# Patient Record
Sex: Female | Born: 1960 | Race: White | Hispanic: No | Marital: Married | Smoking: Current every day smoker
Health system: Southern US, Community
[De-identification: ages and names within clinical notes are randomized; demographics above are authoritative.]

## PROBLEM LIST (undated history)

## (undated) DIAGNOSIS — Z789 Other specified health status: Secondary | ICD-10-CM

---

## 1996-09-21 HISTORY — PX: NASAL SEPTUM SURGERY: SHX37

## 1998-05-23 ENCOUNTER — Other Ambulatory Visit: Admission: RE | Admit: 1998-05-23 | Discharge: 1998-05-23 | Payer: Self-pay | Admitting: Obstetrics and Gynecology

## 2000-04-19 ENCOUNTER — Other Ambulatory Visit: Admission: RE | Admit: 2000-04-19 | Discharge: 2000-04-19 | Payer: Self-pay | Admitting: Gynecology

## 2004-08-28 ENCOUNTER — Other Ambulatory Visit: Admission: RE | Admit: 2004-08-28 | Discharge: 2004-08-28 | Payer: Self-pay | Admitting: Gynecology

## 2006-03-25 ENCOUNTER — Other Ambulatory Visit: Admission: RE | Admit: 2006-03-25 | Discharge: 2006-03-25 | Payer: Self-pay | Admitting: Gynecology

## 2006-10-25 ENCOUNTER — Other Ambulatory Visit: Admission: RE | Admit: 2006-10-25 | Discharge: 2006-10-25 | Payer: Self-pay | Admitting: Gynecology

## 2007-03-28 ENCOUNTER — Other Ambulatory Visit: Admission: RE | Admit: 2007-03-28 | Discharge: 2007-03-28 | Payer: Self-pay | Admitting: Gynecology

## 2008-05-09 ENCOUNTER — Other Ambulatory Visit: Admission: RE | Admit: 2008-05-09 | Discharge: 2008-05-09 | Payer: Self-pay | Admitting: Gynecology

## 2014-02-20 ENCOUNTER — Encounter: Payer: Self-pay | Admitting: Family Medicine

## 2014-03-12 ENCOUNTER — Encounter: Payer: Self-pay | Admitting: Physician Assistant

## 2014-03-12 ENCOUNTER — Ambulatory Visit (INDEPENDENT_AMBULATORY_CARE_PROVIDER_SITE_OTHER): Payer: BC Managed Care – PPO | Admitting: Physician Assistant

## 2014-03-12 VITALS — BP 132/78 | HR 68 | Temp 98.5°F | Resp 18 | Ht 65.75 in | Wt 140.0 lb

## 2014-03-12 DIAGNOSIS — Z23 Encounter for immunization: Secondary | ICD-10-CM

## 2014-03-12 DIAGNOSIS — F17200 Nicotine dependence, unspecified, uncomplicated: Secondary | ICD-10-CM

## 2014-03-12 DIAGNOSIS — Z0000 Encounter for general adult medical examination without abnormal findings: Secondary | ICD-10-CM

## 2014-03-12 MED ORDER — VARENICLINE TARTRATE 1 MG PO TABS: 1.0000 mg | ORAL_TABLET | Freq: Two times a day (BID) | ORAL | Status: DC

## 2014-03-12 MED ORDER — VARENICLINE TARTRATE 0.5 MG PO TABS: 0.5000 mg | ORAL_TABLET | Freq: Two times a day (BID) | ORAL | Status: DC

## 2014-03-12 NOTE — Progress Notes (Signed)
Patient ID: Laura Beasley MRN: [id number], DOB: Feb 17, 1961, 53 y.o. Date of Encounter: 03/12/2014,   Chief Complaint: Physical (CPE)  HPI: 53 y.o. y/o white female  here for CPE. Is also here as a new patient to establish care with our office.  She says that she has seen a medical provider in 4 years. Prior to that she was seeing a gynecologist in [place]. She has had no other medical provider other than her GYN.  She moved from [place] in 1993.  She has no specific complaints or concerns today.   Review of Systems: Consitutional: No fever, chills, fatigue, night sweats, lymphadenopathy. No significant/unexplained weight changes. Eyes: No visual changes, eye redness, or discharge. ENT/Mouth: No ear pain, sore throat, nasal drainage, or sinus pain. Cardiovascular: No chest pressure,heaviness, tightness or squeezing, even with exertion. No increased shortness of breath or dyspnea on exertion.No palpitations, edema, orthopnea, PND. Respiratory: No cough, hemoptysis, SOB, or wheezing. Gastrointestinal: No anorexia, dysphagia, reflux, pain, nausea, vomiting, hematemesis, diarrhea, constipation, BRBPR, or melena. Breast: No mass, nodules, bulging, or retraction. No skin changes or inflammation. No nipple discharge. No lymphadenopathy. Genitourinary: No dysuria, hematuria, incontinence, vaginal discharge, pruritis, burning, abnormal bleeding, or pain. Musculoskeletal: No decreased ROM, No joint pain or swelling. No significant pain in neck, back, or extremities. Skin: No rash, pruritis, or concerning lesions. Neurological: No headache, dizziness, syncope, seizures, tremors, memory loss, coordination problems, or paresthesias. Psychological: No anxiety, depression, hallucinations, SI/HI. Endocrine: No polydipsia, polyphagia, polyuria, or known diabetes.No increased fatigue. No palpitations/rapid heart rate. No significant/unexplained weight change. All other systems were reviewed and are  otherwise negative.  Past Medical History  Diagnosis Date  . Allergy 2003     Past Surgical History  Procedure Laterality Date  . Cesarean section  05/14/93  . Nasal septum surgery  09/21/1996    Home Meds:  No outpatient prescriptions prior to visit.   No facility-administered medications prior to visit.    Allergies:  Allergies  Allergen Reactions  . Depo-Provera [Medroxyprogesterone]     History   Social History  . Marital Status: Married    Spouse Name: N/A    Number of Children: N/A  . Years of Education: N/A   Occupational History  . Not on file.   Social History Main Topics  . Smoking status: Current Every Day Smoker -- 1.00 packs/day for 35 years    Types: Cigarettes  . Smokeless tobacco: Never Used  . Alcohol Use: No  . Drug Use: No  . Sexual Activity: Yes    Birth Control/ Protection: None   Other Topics Concern  . 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   Has 10 acres-- does walking around there.   No Formal exercise.   Smokes 1ppd since age 31.   No alcohol.    Family History  Problem Relation Age of Onset  . Diabetes Father   . Heart disease Father     Physical Exam: Blood pressure 132/78, pulse 68, temperature 98.5 F (36.9 C), temperature source Oral, resp. rate 18, height 5' 5.75" (1.67 m), weight 140 lb (63.504 kg)., Body mass index is 22.77 kg/(m^2). General: Well developed, well nourished, WF. Appears in no acute distress. HEENT: Normocephalic, atraumatic. Conjunctiva pink, sclera non-icteric. Pupils 2 mm constricting to 1 mm, round, regular, and equally reactive to light and accomodation. EOMI. Internal auditory canal clear. TMs with good cone of light and without pathology. Nasal mucosa  pink. Nares are without discharge. No sinus tenderness. Oral mucosa pink.  Pharynx without exudate.   Neck: Supple. Trachea midline. No thyromegaly. Full ROM. No  lymphadenopathy.No Carotid Bruits. Lungs: Clear to auscultation bilaterally without wheezes, rales, or rhonchi. Breathing is of normal effort and unlabored. Cardiovascular: RRR with S1 S2. No murmurs, rubs, or gallops. Distal pulses 2+ symmetrically. No carotid or abdominal bruits. Breast: Symmetrical. No masses. Nipples without discharge. Abdomen: Soft, non-tender, non-distended with normoactive bowel sounds. No hepatosplenomegaly or masses. No rebound/guarding. No CVA tenderness. No hernias.  Genitourinary:  External genitalia without lesions. Vaginal mucosa pink.No discharge present. Cervix pink and without discharge. No cervical tenderness.Normal uterus size. No adnexal mass or tenderness.  Pap smear taken. Musculoskeletal: Full range of motion and 5/5 strength throughout. Without swelling, atrophy, tenderness, crepitus, or warmth. Extremities without clubbing, cyanosis, or edema. Calves supple. Skin: Warm and moist without erythema, ecchymosis, wounds, or rash. Neuro: A+Ox3. CN II-XII grossly intact. Moves all extremities spontaneously. Full sensation throughout. Normal gait. DTR 2+ throughout upper and lower extremities. Finger to nose intact. Psych:  Responds to questions appropriately with a normal affect.   Assessment/Plan:  53 y.o.  y/o female here for CPE 1. Visit for preventive health examination  A. Screening Labs: She currently is not fasting but says that she can easily return fasting for lab work. - CBC with Differential; Future - COMPLETE METABOLIC PANEL WITH GFR; Future - Lipid panel; Future - TSH; Future - MM Digital Screening; Future - Ambulatory referral to Gastroenterology  B. Pap: She says her last Pap smear was at least 4 or 5 years ago. - PAP, Thin Prep w/HPV rflx HPV Type 16/18  C. screening mammogram: She says that last mammogram was at least 4 or 5 years ago. She is agreeable for me to schedule followup mammogram.  D. DEXA/BMD: wait until closer to age 53 to  schedule this.  E.Colorectal Cancer Screening: She has had never had a screening colonoscopy. Discussed the risk and benefits and she is agreeable to proceed. Will refer her to GI for this.  F. Immunizations:  Influenza:--------------N/A Tetanus:--------------- she says that this is over due and she is agreeable to get this today. Pneumococcal:------ and that she is a smoker she needs to have pneumonia vaccine. Recommended pneumonia vaccine today but she defers.(Hopefeully she will quit smoking with Chantix) Zostavax:--------------Will discuss at age 40.  2. Smoker Discussed the need for smoking cessation. She is agreeable to take some medication to help with this and agrees that she does need to quit smoking. Discussed possible adverse effects with the medication including possible change in mood or behavior and even suicidal ideation. She voices understanding and agrees to stop the medicine and call me immediately if develops any such adverse effects. Gave her a savings card as well as a starting pack of medication sample. - varenicline (CHANTIX) 0.5 MG tablet; Take 1 tablet (0.5 mg total) by mouth 2 (two) times daily.  Dispense: 60 tablet; Refill: 0 - varenicline (CHANTIX CONTINUING MONTH PAK) 1 MG tablet; Take 1 tablet (1 mg total) by mouth 2 (two) times daily.  Dispense: 60 tablet; Refill: 3  3. Need for prophylactic vaccination with combined diphtheria-tetanus-pertussis (DTP) vaccine - Tdap vaccine greater than or equal to 7yo IM   Signed, [address] [place], [place], [place] 03/12/2014 1:41 PM

## 2014-03-13 ENCOUNTER — Other Ambulatory Visit: Payer: BC Managed Care – PPO

## 2014-03-13 DIAGNOSIS — Z0000 Encounter for general adult medical examination without abnormal findings: Secondary | ICD-10-CM

## 2014-03-13 LAB — LIPID PANEL
CHOL/HDL RATIO: 3 ratio
Cholesterol: 201 mg/dL — ABNORMAL HIGH (ref 0–200)
HDL: 68 mg/dL (ref >39)
LDL Cholesterol: 109 mg/dL — ABNORMAL HIGH (ref 0–99)
Triglycerides: 120 mg/dL (ref <150)
VLDL: 24 mg/dL (ref 0–40)

## 2014-03-13 LAB — COMPLETE METABOLIC PANEL WITH GFR
ALBUMIN: 4.3 g/dL (ref 3.5–5.2)
ALK PHOS: 57 U/L (ref 39–117)
ALT: 10 U/L (ref 0–35)
AST: 15 U/L (ref 0–37)
BUN: 17 mg/dL (ref 6–23)
CALCIUM: 9.2 mg/dL (ref 8.4–10.5)
CHLORIDE: 106 meq/L (ref 96–112)
CO2: 23 meq/L (ref 19–32)
Creat: 1 mg/dL (ref 0.50–1.10)
GFR, EST NON AFRICAN AMERICAN: 66 mL/min
GFR, Est African American: 76 mL/min
GLUCOSE: 96 mg/dL (ref 70–99)
POTASSIUM: 4.2 meq/L (ref 3.5–5.3)
SODIUM: 138 meq/L (ref 135–145)
TOTAL PROTEIN: 7 g/dL (ref 6.0–8.3)
Total Bilirubin: 0.5 mg/dL (ref 0.2–1.2)

## 2014-03-13 LAB — CBC WITH DIFFERENTIAL/PLATELET
BASOS PCT: 1 % (ref 0–1)
Basophils Absolute: 0.1 10*3/uL (ref 0.0–0.1)
Eosinophils Absolute: 0.2 10*3/uL (ref 0.0–0.7)
Eosinophils Relative: 2 % (ref 0–5)
HEMATOCRIT: 41.3 % (ref 36.0–46.0)
Hemoglobin: 14.1 g/dL (ref 12.0–15.0)
LYMPHS ABS: 2.5 10*3/uL (ref 0.7–4.0)
LYMPHS PCT: 26 % (ref 12–46)
MCH: 28.9 pg (ref 26.0–34.0)
MCHC: 34.1 g/dL (ref 30.0–36.0)
MCV: 84.6 fL (ref 78.0–100.0)
MONO ABS: 0.7 10*3/uL (ref 0.1–1.0)
Monocytes Relative: 7 % (ref 3–12)
NEUTROS ABS: 6.3 10*3/uL (ref 1.7–7.7)
NEUTROS PCT: 64 % (ref 43–77)
Platelets: 257 10*3/uL (ref 150–400)
RBC: 4.88 MIL/uL (ref 3.87–5.11)
RDW: 14.5 % (ref 11.5–15.5)
WBC: 9.8 10*3/uL (ref 4.0–10.5)

## 2014-03-13 LAB — TSH: TSH: 0.816 u[IU]/mL (ref 0.350–4.500)

## 2014-03-14 LAB — PAP, THIN PREP W/HPV RFLX HPV TYPE 16/18: HPV DNA High Risk: NOT DETECTED

## 2014-03-19 ENCOUNTER — Ambulatory Visit
Admission: RE | Admit: 2014-03-19 | Discharge: 2014-03-19 | Disposition: A | Discharge status: Home / Self Care | Payer: BC Managed Care – PPO | Source: Ambulatory Visit | Attending: Physician Assistant | Admitting: Physician Assistant

## 2014-03-19 DIAGNOSIS — Z0000 Encounter for general adult medical examination without abnormal findings: Secondary | ICD-10-CM

## 2014-03-22 ENCOUNTER — Telehealth: Payer: Self-pay

## 2014-03-22 ENCOUNTER — Other Ambulatory Visit: Payer: Self-pay

## 2014-03-22 DIAGNOSIS — Z1211 Encounter for screening for malignant neoplasm of colon: Secondary | ICD-10-CM

## 2014-03-22 NOTE — Telephone Encounter (Signed)
Pt was referred by Dena Billet for screening colonoscopy. LMOM to call. I called and many rings and no answer.

## 2014-03-22 NOTE — Telephone Encounter (Signed)
Gastroenterology Pre-Procedure Review  Request Date: 03/22/2014 Requesting Physician: Olean Ree Dixon  PATIENT REVIEW QUESTIONS: The patient responded to the following health history questions as indicated:    1. Diabetes Melitis: no 2. Joint replacements in the past 12 months: no 3. Major health problems in the past 3 months: no 4. Has an artificial valve or MVP: no 5. Has a defibrillator: no 6. Has been advised in past to take antibiotics in advance of a procedure like teeth cleaning: no    MEDICATIONS & ALLERGIES:    Patient reports the following regarding taking any blood thinners:   Plavix? no Aspirin? no Coumadin? no  Patient confirms/reports the following medications:  Current Outpatient Prescriptions  Medication Sig Dispense Refill  . POTASSIUM GLUCONATE PO Take 1 capsule by mouth as needed. Takes for muscle cramps      . varenicline (CHANTIX) 0.5 MG tablet Take 1 tablet (0.5 mg total) by mouth 2 (two) times daily.  60 tablet  0  . varenicline (CHANTIX CONTINUING MONTH PAK) 1 MG tablet Take 1 tablet (1 mg total) by mouth 2 (two) times daily.  60 tablet  3   No current facility-administered medications for this visit.    Patient confirms/reports the following allergies:  Allergies  Allergen Reactions  . Depo-Provera [Medroxyprogesterone]     No orders of the defined types were placed in this encounter.    AUTHORIZATION INFORMATION Primary Insurance:   ID #:   Group #:  Pre-Cert / Auth required Pre-Cert / Auth #:   Secondary Insurance:   ID #:   Group #:  Pre-Cert / Auth required: Pre-Cert / Auth #:   SCHEDULE INFORMATION: Procedure has been scheduled as follows:  Date: 04/16/2014                   Time: 9:15 AM  Location: [facility] Short Stay  This Gastroenterology Pre-Precedure Review Form is being routed to the following provider(s): Barney Drain, MD

## 2014-03-26 NOTE — Telephone Encounter (Signed)
PREPOPIK-DRINK WATER TO KEEP URINE LIGHT YELLOW. FULL LIQUIDS WITH BREAKFAST.  PT SHOULD DROP OFF RX 3 DAYS PRIOR TO PROCEDURE.

## 2014-03-28 MED ORDER — SOD PICOSULFATE-MAG OX-CIT ACD 10-3.5-12 MG-GM-GM PO PACK: 1.0000 | PACK | ORAL | Status: DC

## 2014-03-28 NOTE — Telephone Encounter (Signed)
Rx sent to the pharmacy and instructions mailed to pt.  

## 2014-04-10 ENCOUNTER — Telehealth: Payer: Self-pay

## 2014-04-10 NOTE — Telephone Encounter (Signed)
I called [employer] at [phone] and spoke to [place] who said that a PA is not required for outpatient colonoscopy.

## 2014-04-12 ENCOUNTER — Encounter (HOSPITAL_COMMUNITY): Payer: Self-pay | Admitting: Pharmacy Technician

## 2014-04-16 ENCOUNTER — Encounter (HOSPITAL_COMMUNITY)
Admission: RE | Disposition: A | Discharge status: Home / Self Care | Payer: Self-pay | Source: Ambulatory Visit | Attending: Gastroenterology

## 2014-04-16 ENCOUNTER — Ambulatory Visit (HOSPITAL_COMMUNITY)
Admission: RE | Admit: 2014-04-16 | Discharge: 2014-04-16 | Disposition: A | Discharge status: Home / Self Care | Payer: BC Managed Care – PPO | Source: Ambulatory Visit | Attending: Gastroenterology | Admitting: Gastroenterology

## 2014-04-16 ENCOUNTER — Encounter (HOSPITAL_COMMUNITY): Payer: Self-pay | Admitting: *Deleted

## 2014-04-16 DIAGNOSIS — K5730 Diverticulosis of large intestine without perforation or abscess without bleeding: Secondary | ICD-10-CM | POA: Insufficient documentation

## 2014-04-16 DIAGNOSIS — Q438 Other specified congenital malformations of intestine: Secondary | ICD-10-CM | POA: Insufficient documentation

## 2014-04-16 DIAGNOSIS — D128 Benign neoplasm of rectum: Secondary | ICD-10-CM | POA: Insufficient documentation

## 2014-04-16 DIAGNOSIS — D126 Benign neoplasm of colon, unspecified: Secondary | ICD-10-CM

## 2014-04-16 DIAGNOSIS — Z888 Allergy status to other drugs, medicaments and biological substances status: Secondary | ICD-10-CM | POA: Insufficient documentation

## 2014-04-16 DIAGNOSIS — Z1211 Encounter for screening for malignant neoplasm of colon: Secondary | ICD-10-CM | POA: Insufficient documentation

## 2014-04-16 DIAGNOSIS — Z79899 Other long term (current) drug therapy: Secondary | ICD-10-CM | POA: Insufficient documentation

## 2014-04-16 DIAGNOSIS — K648 Other hemorrhoids: Secondary | ICD-10-CM | POA: Insufficient documentation

## 2014-04-16 DIAGNOSIS — D129 Benign neoplasm of anus and anal canal: Secondary | ICD-10-CM

## 2014-04-16 DIAGNOSIS — F17200 Nicotine dependence, unspecified, uncomplicated: Secondary | ICD-10-CM | POA: Insufficient documentation

## 2014-04-16 HISTORY — DX: Other specified health status: Z78.9

## 2014-04-16 HISTORY — PX: COLONOSCOPY: SHX5424

## 2014-04-16 SURGERY — COLONOSCOPY
Anesthesia: Moderate Sedation

## 2014-04-16 MED ORDER — MEPERIDINE HCL 100 MG/ML IJ SOLN
INTRAMUSCULAR | Status: DC | PRN
  Administered 2014-04-16 (×3): 25 mg via INTRAVENOUS

## 2014-04-16 MED ORDER — SODIUM CHLORIDE 0.9 % IV SOLN
INTRAVENOUS | Status: DC
  Administered 2014-04-16: 09:00:00 via INTRAVENOUS

## 2014-04-16 MED ORDER — MIDAZOLAM HCL 5 MG/5ML IJ SOLN
INTRAMUSCULAR | Status: AC
  Filled 2014-04-16: qty 10

## 2014-04-16 MED ORDER — MIDAZOLAM HCL 5 MG/5ML IJ SOLN
INTRAMUSCULAR | Status: DC | PRN
  Administered 2014-04-16: 1 mg via INTRAVENOUS
  Administered 2014-04-16 (×2): 2 mg via INTRAVENOUS
  Administered 2014-04-16: 1 mg via INTRAVENOUS

## 2014-04-16 MED ORDER — MEPERIDINE HCL 100 MG/ML IJ SOLN
INTRAMUSCULAR | Status: AC
  Filled 2014-04-16: qty 2

## 2014-04-16 MED ORDER — STERILE WATER FOR IRRIGATION IR SOLN
Status: DC | PRN
  Administered 2014-04-16: 10:00:00

## 2014-04-16 NOTE — Op Note (Signed)
[facility] [address] [place], [address]   COLONOSCOPY PROCEDURE REPORT  PATIENT: Laura Beasley, Laura Beasley  MR#: [id number] BIRTHDATE: 01-May-1961 , 52  yrs. old GENDER: Female ENDOSCOPIST: Barney Drain, MD REFERRED [place]:[facility] Ardath Sax, PA-C PROCEDURE DATE:  04/16/2014 PROCEDURE:   Colonoscopy with snare polypectomy/ENDOCUFF INDICATIONS:Average risk patient for colon cancer. MEDICATIONS: Demerol 75 mg IV and Versed 6 mg IV  DESCRIPTION OF PROCEDURE:    Physical exam was performed.  Informed consent was obtained from the patient after explaining the benefits, risks, and alternatives to procedure.  The patient was connected to monitor and placed in left lateral position. Continuous oxygen was provided by nasal cannula and IV medicine administered through an indwelling cannula.  After administration of sedation and rectal exam, the patients rectum was intubated and the EC-[id number] ([id number])  colonoscope was advanced under direct visualization to the ileum.  The scope was removed slowly by carefully examining the color, texture, anatomy, and integrity mucosa on the way out.  The patient was recovered in endoscopy and discharged home in satisfactory condition.    COLON FINDINGS: The mucosa appeared normal in the terminal ileum.  , The LEFT colon IS redundant.  Manual abdominal counter-pressure was used to reach the cecum.  The patient was moved on to their back to reach the cecum, There was moderate diverticulosis noted in the sigmoid colon with associated angulation and muscular hypertrophy. , A semi-pedunculated polyp measuring 6 mm in size was found in the rectum.  A polypectomy was performed using snare cautery.  , and Moderate sized internal hemorrhoids were found.  PREP QUALITY: good.  CECAL W/D TIME: 14 minutes     COMPLICATIONS: None  ENDOSCOPIC IMPRESSION: 1.   Normal mucosa in the terminal ileum 2.   The LEFT colon IS  redundant 3.   Moderate diverticulosis  noted in the sigmoid colon 4.  ONE Semi-pedunculated RECTAL polyp 5.   Moderate sized internal hemorrhoids  RECOMMENDATIONS: FOLLOW A HIGH FIBER DIET.  AVOID ITEMS THAT CAUSE BLOATING. BIOPSY RESULTS SHOULD BE BACK IN 7 DAYS.  Next colonoscopy in 5-10 years.  Consider an overtube.       _______________________________ eSignedBarney Drain, MD 04/16/2014 3:49 PM

## 2014-04-16 NOTE — Discharge Instructions (Signed)
You had 1 polyp removed. You have internal hemorrhoids and diverticulosis IN YOUR LEFT COLON.   FOLLOW A HIGH FIBER DIET. AVOID ITEMS THAT CAUSE BLOATING. SEE INFO BELOW.  YOUR BIOPSY RESULTS SHOULD BE BACK IN 7 DAYS.  Next colonoscopy in 5-10 years.   Colonoscopy Care After Read the instructions outlined below and refer to this sheet in the next week. These discharge instructions provide you with general information on caring for yourself after you leave the hospital. While your treatment has been planned according to the most current medical practices available, unavoidable complications occasionally occur. If you have any problems or questions after discharge, call DR. Stephan Nelis, [phone].  ACTIVITY  You may resume your regular activity, but move at a slower pace for the next 24 hours.   Take frequent rest periods for the next 24 hours.   Walking will help get rid of the air and reduce the bloated feeling in your belly (abdomen).   No driving for 24 hours (because of the medicine (anesthesia) used during the test).   You may shower.   Do not sign any important legal documents or operate any machinery for 24 hours (because of the anesthesia used during the test).    NUTRITION  Drink plenty of fluids.   You may resume your normal diet as instructed by your doctor.   Begin with a light meal and progress to your normal diet. Heavy or fried foods are harder to digest and may make you feel sick to your stomach (nauseated).   Avoid alcoholic beverages for 24 hours or as instructed.    MEDICATIONS  You may resume your normal medications.   WHAT YOU CAN EXPECT TODAY  Some feelings of bloating in the abdomen.   Passage of more gas than usual.   Spotting of blood in your stool or on the toilet paper  .  IF YOU HAD POLYPS REMOVED DURING THE COLONOSCOPY:  Eat a soft diet IF YOU HAVE NAUSEA, BLOATING, ABDOMINAL PAIN, OR VOMITING.    FINDING OUT THE RESULTS OF YOUR  TEST Not all test results are available during your visit. DR. Oneida Alar WILL CALL YOU WITHIN 7 DAYS OF YOUR PROCEDUE WITH YOUR RESULTS. Do not assume everything is normal if you have not heard from DR. Jordi Lacko IN ONE WEEK, CALL HER OFFICE AT [phone].  SEEK IMMEDIATE MEDICAL ATTENTION AND CALL THE OFFICE: [phone] IF:  You have more than a spotting of blood in your stool.   Your belly is swollen (abdominal distention).   You are nauseated or vomiting.   You have a temperature over 101F.   You have abdominal pain or discomfort that is severe or gets worse throughout the day.  Polyps, Colon  A polyp is extra tissue that grows inside your body. Colon polyps grow in the large intestine. The large intestine, also called the colon, is part of your digestive system. It is a long, hollow tube at the end of your digestive tract where your body makes and stores stool. Most polyps are not dangerous. They are benign. This means they are not cancerous. But over time, some types of polyps can turn into cancer. Polyps that are smaller than a pea are usually not harmful. But larger polyps could someday become or may already be cancerous. To be safe, doctors remove all polyps and test them.   WHO GETS POLYPS? Anyone can get polyps, but certain people are more likely than others. You may have a greater chance of getting polyps  if:  You are over 50.   You have had polyps before.   Someone in your family has had polyps.   Someone in your family has had cancer of the large intestine.   Find out if someone in your family has had polyps. You may also be more likely to get polyps if you:   Eat a lot of fatty foods   Smoke   Drink alcohol   Do not exercise  Eat too much   TREATMENT  The caregiver will remove the polyp during sigmoidoscopy or colonoscopy.  PREVENTION There is not one sure way to prevent polyps. You might be able to lower your risk of getting them if you:  Eat more fruits and  vegetables and less fatty food.   Do not smoke.   Avoid alcohol.   Exercise every day.   Lose weight if you are overweight.   Eating more calcium and folate can also lower your risk of getting polyps. Some foods that are rich in calcium are milk, cheese, and broccoli. Some foods that are rich in folate are chickpeas, kidney beans, and spinach.   High-Fiber Diet A high-fiber diet changes your normal diet to include more whole grains, legumes, fruits, and vegetables. Changes in the diet involve replacing refined carbohydrates with unrefined foods. The calorie level of the diet is essentially unchanged. The Dietary Reference Intake (recommended amount) for adult males is 38 grams per day. For adult females, it is 25 grams per day. Pregnant and lactating women should consume 28 grams of fiber per day. Fiber is the intact part of a plant that is not broken down during digestion. Functional fiber is fiber that has been isolated from the plant to provide a beneficial effect in the body. PURPOSE  Increase stool bulk.   Ease and regulate bowel movements.   Lower cholesterol.  INDICATIONS THAT YOU NEED MORE FIBER  Constipation and hemorrhoids.   Uncomplicated diverticulosis (intestine condition) and irritable bowel syndrome.   Weight management.   As a protective measure against hardening of the arteries (atherosclerosis), diabetes, and cancer.   GUIDELINES FOR INCREASING FIBER IN THE DIET  Start adding fiber to the diet slowly. A gradual increase of about 5 more grams (2 slices of whole-wheat bread, 2 servings of most fruits or vegetables, or 1 bowl of high-fiber cereal) per day is best. Too rapid an increase in fiber may result in constipation, flatulence, and bloating.   Drink enough water and fluids to keep your urine clear or pale yellow. Water, juice, or caffeine-free drinks are recommended. Not drinking enough fluid may cause constipation.   Eat a variety of high-fiber foods rather  than one type of fiber.   Try to increase your intake of fiber through using high-fiber foods rather than fiber pills or supplements that contain small amounts of fiber.   The goal is to change the types of food eaten. Do not supplement your present diet with high-fiber foods, but replace foods in your present diet.  INCLUDE A VARIETY OF FIBER SOURCES  Replace refined and processed grains with whole grains, canned fruits with fresh fruits, and incorporate other fiber sources. White rice, white breads, and most bakery goods contain little or no fiber.   Brown whole-grain rice, buckwheat oats, and many fruits and vegetables are all good sources of fiber. These include: broccoli, Brussels sprouts, cabbage, cauliflower, beets, sweet potatoes, white potatoes (skin on), carrots, tomatoes, eggplant, squash, berries, fresh fruits, and dried fruits.   Cereals appear  to be the richest source of fiber. Cereal fiber is found in whole grains and bran. Bran is the fiber-rich outer coat of cereal grain, which is largely removed in refining. In whole-grain cereals, the bran remains. In breakfast cereals, the largest amount of fiber is found in those with "bran" in their names. The fiber content is sometimes indicated on the label.   You may need to include additional fruits and vegetables each day.   In baking, for 1 cup white flour, you may use the following substitutions:   1 cup whole-wheat flour minus 2 tablespoons.   1/2 cup white flour plus 1/2 cup whole-wheat flour.   Diverticulosis Diverticulosis is a common condition that develops when small pouches (diverticula) form in the wall of the colon. The risk of diverticulosis increases with age. It happens more often in people who eat a low-fiber diet. Most individuals with diverticulosis have no symptoms. Those individuals with symptoms usually experience belly (abdominal) pain, constipation, or loose stools (diarrhea).  HOME CARE  INSTRUCTIONS  Increase the amount of fiber in your diet as directed by your caregiver or dietician. This may reduce symptoms of diverticulosis.   Drink at least 6 to 8 glasses of water each day to prevent constipation.   Try not to strain when you have a bowel movement.   Avoiding nuts and seeds to prevent complications is still an uncertain benefit.       FOODS HAVING HIGH FIBER CONTENT INCLUDE:  Fruits. Apple, peach, pear, tangerine, raisins, prunes.   Vegetables. Brussels sprouts, asparagus, broccoli, cabbage, carrot, cauliflower, romaine lettuce, spinach, summer squash, tomato, winter squash, zucchini.   Starchy Vegetables. Baked beans, kidney beans, lima beans, split peas, lentils, potatoes (with skin).   Grains. Whole wheat bread, brown rice, bran flake cereal, plain oatmeal, white rice, shredded wheat, bran muffins.    SEEK IMMEDIATE MEDICAL CARE IF:  You develop increasing pain or severe bloating.   You have an oral temperature above 101F.   You develop vomiting or bowel movements that are bloody or black.   Hemorrhoids Hemorrhoids are dilated (enlarged) veins around the rectum. Sometimes clots will form in the veins. This makes them swollen and painful. These are called thrombosed hemorrhoids. Causes of hemorrhoids include:  Constipation.   Straining to have a bowel movement.   HEAVY LIFTING HOME CARE INSTRUCTIONS  Eat a well balanced diet and drink 6 to 8 glasses of water every day to avoid constipation. You may also use a bulk laxative.   Avoid straining to have bowel movements.   Keep anal area dry and clean.   Do not use a donut shaped pillow or sit on the toilet for long periods. This increases blood pooling and pain.   Move your bowels when your body has the urge; this will require less straining and will decrease pain and pressure.

## 2014-04-16 NOTE — H&P (Signed)
  Primary Care Physician:  Karis Juba, PA-C Primary Gastroenterologist:  Dr. Oneida Alar  Pre-Procedure History & Physical: HPI:  Laura Beasley is a 53 y.o. female here for [place].  Past Medical History  Diagnosis Date  . Medical history non-contributory     Past Surgical History  Procedure Laterality Date  . Cesarean section  05/14/93  . Nasal septum surgery  09/21/1996    Prior to Admission medications   Medication Sig Start Date End Date Taking? Authorizing Provider  ibuprofen (ADVIL,MOTRIN) 200 MG tablet Take 200 mg by mouth every 6 (six) hours as needed for moderate pain.   Yes Historical Provider, MD  POTASSIUM GLUCONATE PO Take 1 capsule by mouth daily as needed (muscle cramps).    Yes Historical Provider, MD  Sod Picosulfate-Mag Ox-Cit Acd 10-3.5-12 MG-GM-GM PACK Take 1 Container by mouth as directed. 03/28/14  Yes Danie Binder, MD    Allergies as of 03/22/2014 - Review Complete 03/22/2014  Allergen Reaction Noted  . Depo-provera [medroxyprogesterone]  02/20/2014    Family History  Problem Relation Age of Onset  . Diabetes Father   . Heart disease Father   . Colon cancer Neg Hx     History   Social History  . Marital Status: Married    Spouse Name: N/A    Number of Children: N/A  . Years of Education: N/A   Occupational History  . Not on file.   Social History Main Topics  . Smoking status: Current Every Day Smoker -- 0.50 packs/day for 37 years    Types: Cigarettes  . Smokeless tobacco: Never Used  . Alcohol Use: No  . Drug Use: No  . Sexual Activity: Yes    Birth Control/ Protection: None   Other Topics Concern  . 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   Has [place] acres-- does walking around there.   No Formal exercise.   Smokes 1ppd since age 53.   No alcohol.    Review of Systems: See HPI, otherwise negative ROS   Physical Exam: BP 131/75  Pulse  68  Temp(Src) 97.7 F (36.5 C) (Oral)  Resp 18  Ht 5\' 5"  (1.651 m)  Wt 140 lb (63.504 kg)  BMI 23.30 kg/m2  SpO2 96% General:   Alert,  pleasant and cooperative in NAD Head:  Normocephalic and atraumatic. Neck:  Supple; Lungs:  Clear throughout to auscultation.    Heart:  Regular rate and rhythm. Abdomen:  Soft, nontender and nondistended. Normal bowel sounds, without guarding, and without rebound.   Neurologic:  Alert and  oriented x4;  grossly normal neurologically.  Impression/Plan:     SCREENING  Plan:  1. TCS TODAY

## 2014-04-20 ENCOUNTER — Encounter (HOSPITAL_COMMUNITY): Payer: Self-pay | Admitting: Gastroenterology

## 2014-04-26 ENCOUNTER — Telehealth: Payer: Self-pay | Admitting: Gastroenterology

## 2014-04-26 NOTE — Telephone Encounter (Signed)
Please call pt. She had a simple adenoma removed. FOLLOW A High fiber diet. TCS in 5-10 years.

## 2014-04-27 NOTE — Telephone Encounter (Signed)
Called and informed pt.  

## 2015-04-18 ENCOUNTER — Other Ambulatory Visit (HOSPITAL_COMMUNITY): Payer: Self-pay | Admitting: Internal Medicine

## 2015-04-18 DIAGNOSIS — R109 Unspecified abdominal pain: Secondary | ICD-10-CM

## 2015-04-22 ENCOUNTER — Encounter: Payer: Self-pay | Admitting: Gastroenterology

## 2015-04-22 ENCOUNTER — Ambulatory Visit (HOSPITAL_COMMUNITY)
Admission: RE | Admit: 2015-04-22 | Discharge: 2015-04-22 | Disposition: A | Discharge status: Home / Self Care | Payer: PRIVATE HEALTH INSURANCE | Source: Ambulatory Visit | Attending: Internal Medicine | Admitting: Internal Medicine

## 2015-04-22 DIAGNOSIS — R109 Unspecified abdominal pain: Secondary | ICD-10-CM

## 2015-04-22 DIAGNOSIS — K8020 Calculus of gallbladder without cholecystitis without obstruction: Secondary | ICD-10-CM | POA: Insufficient documentation

## 2015-04-22 DIAGNOSIS — R1011 Right upper quadrant pain: Secondary | ICD-10-CM | POA: Diagnosis present

## 2015-05-14 ENCOUNTER — Encounter: Payer: Self-pay | Admitting: Gastroenterology

## 2015-05-14 ENCOUNTER — Ambulatory Visit (INDEPENDENT_AMBULATORY_CARE_PROVIDER_SITE_OTHER): Payer: PRIVATE HEALTH INSURANCE | Admitting: Gastroenterology

## 2015-05-14 VITALS — BP 135/76 | HR 78 | Temp 97.6°F | Ht 67.0 in | Wt 153.8 lb

## 2015-05-14 DIAGNOSIS — R197 Diarrhea, unspecified: Secondary | ICD-10-CM

## 2015-05-14 DIAGNOSIS — R140 Abdominal distension (gaseous): Secondary | ICD-10-CM

## 2015-05-14 DIAGNOSIS — R110 Nausea: Secondary | ICD-10-CM | POA: Diagnosis not present

## 2015-05-14 DIAGNOSIS — K8020 Calculus of gallbladder without cholecystitis without obstruction: Secondary | ICD-10-CM | POA: Insufficient documentation

## 2015-05-14 MED ORDER — PANTOPRAZOLE SODIUM 40 MG PO TBEC: 40.0000 mg | DELAYED_RELEASE_TABLET | Freq: Every day | ORAL | Status: AC

## 2015-05-14 NOTE — Progress Notes (Signed)
Primary Care Physician:  Delphina Cahill, MD  Primary Gastroenterologist:  Barney Drain, MD   Chief Complaint  Patient presents with  . Abdominal Pain    HPI:  Laura Beasley is a 54 y.o. female here for history of gallstones. Patient underwent a screening colonoscopy with Dr. Oneida Alar back in August 2015. Left colon was redundant. Moderate diverticulosis in the sigmoid colon. Moderate sized internal hemorrhoids. She had a tubular adenoma removed from her rectum. Next colonoscopy within 5-10 years consider overtube.  Patient recently referred by PCP for cholelithiasis. She complains of bloating, nausea. Weight gain of 13 pounds in one year. Avoids fried/greasy foods. Tried Prilosec for 3 days but no improvement. Little bit of heartburn. Very minimal. Nausea/bloating for one year. BM 2-3 times day, [place] 4-7 for several years. No nocturnal BMs. No melena, brbpr. Denies lactose intolerance. On probiotics for 9 months. Complains of bloating with very small portions of food. No abdominal pain.    No current outpatient prescriptions on file.   No current facility-administered medications for this visit.    Allergies as of 05/14/2015 - Review Complete 05/14/2015  Allergen Reaction Noted  . Depo-provera [medroxyprogesterone] Hives 02/20/2014    Past Medical History  Diagnosis Date  . Medical history non-contributory     Past Surgical History  Procedure Laterality Date  . Cesarean section  05/14/93  . Nasal septum surgery  09/21/1996  . Colonoscopy N/A 04/16/2014    SLF: 1. Normal mucosa in the terminal ileum 2. The left colon is redundant 3. Moderate diverticulosis noted in the sigmoid colon 4. One semi-pedunculated rectal polyp 5. Moderate sixed internal hemorrhoids    Family History  Problem Relation Age of Onset  . Diabetes Father   . Heart disease Father   . Colon cancer Neg Hx     Social History   Social History  . Marital Status: Married    Spouse Name: N/A  . Number of Children:  1  . Years of Education: N/A   Occupational History  . hairstylist    Social History Main Topics  . Smoking status: Current Every Day Smoker -- 0.50 packs/day for 37 years    Types: Cigarettes  . Smokeless tobacco: Never Used  . Alcohol Use: No  . Drug Use: No  . Sexual Activity: Yes    Birth Control/ Protection: None   Other Topics Concern  . 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   Has 10 acres-- does walking around there.   No Formal exercise.   Smokes 1ppd since age 80.   No alcohol.      ROS:  General: Negative for anorexia, weight loss, fever, chills, fatigue, weakness. Eyes: Negative for vision changes.  ENT: Negative for hoarseness, difficulty swallowing , nasal congestion. CV: Negative for chest pain, angina, palpitations, dyspnea on exertion, peripheral edema.  Respiratory: Negative for dyspnea at rest, dyspnea on exertion, cough, sputum, wheezing.  GI: See history of present illness. GU:  Negative for dysuria, hematuria, urinary incontinence, urinary frequency, nocturnal urination.  MS: Negative for joint pain, low back pain.  Derm: Negative for rash or itching.  Neuro: Negative for weakness, abnormal sensation, seizure, frequent headaches, memory loss, confusion.  Psych: Negative for anxiety, depression, suicidal ideation, hallucinations.  Endo: Negative for unusual weight change.  Heme: Negative for bruising or bleeding. Allergy: Negative for rash or hives.    Physical Examination:  BP 135/76 mmHg  Pulse 78  Temp(Src) 97.6 F (36.4 C) (Oral)  Ht 5\' 7"  (1.702 m)  Wt 153 lb 12.8 oz (69.763 kg)  BMI 24.08 kg/m2   General: Well-nourished, well-developed in no acute distress.  Head: Normocephalic, atraumatic.   Eyes: Conjunctiva pink, no icterus. Mouth: Oropharyngeal mucosa moist and pink , no lesions erythema or exudate. Neck: Supple without thyromegaly, masses, or  lymphadenopathy.  Lungs: Clear to auscultation bilaterally.  Heart: Regular rate and rhythm, no murmurs rubs or gallops.  Abdomen: Bowel sounds are normal, mild diffuse central discomfort with palpation., nondistended, no hepatosplenomegaly or masses, no abdominal bruits or    hernia , no rebound or guarding.  Fullness of right mid-abd without discrete mass.  Rectal: not performed Extremities: No lower extremity edema. No clubbing or deformities.  Neuro: Alert and oriented x 4 , grossly normal neurologically.  Skin: Warm and dry, no rash or jaundice.   Psych: Alert and cooperative, normal mood and affect.  Labs: 04/16/2015 Sodium 141, potassium 4.3, glucose 83, BUN 15, creatinine 0.93, total bilirubin 0.5, alkaline phosphatase 61, AST 17, ALT 15, albumin 4, white blood cell count 6500, hemoglobin 14.3, hematocrit 42.3, platelets 255,000, TSH 1.599.  Imaging Studies: US Abdomen Limited  04/22/2015   CLINICAL DATA:  Right upper quadrant pain .  EXAM: US ABDOMEN LIMITED - RIGHT UPPER QUADRANT  COMPARISON:  None.  FINDINGS: Gallbladder:  Multiple gallstones are noted. Gallbladder wall thickness 1.4 mm. Negative Murphy sign. No pericholecystic fluid collection.  Common bile duct:  Diameter:  6 mm.  Liver:  No focal lesion identified. Within normal limits in parenchymal echogenicity.  IMPRESSION: Multiple gallstones.  No evidence of cholecystitis.   Electronically Signed   By: Marcello Moores  Register   On: 04/22/2015 10:25

## 2015-05-14 NOTE — Patient Instructions (Signed)
1. Continue probiotics.  2. Avoid fiber supplements for now as this can worsen your bloating.  3. Add pantoprazole once daily before breakfast. RX sent to your pharmacy. 4. Please have your labs done. We will contact you within 7-10 business days with results.

## 2015-05-15 LAB — IGA: IgA: 221 mg/dL (ref 69–380)

## 2015-05-15 LAB — TISSUE TRANSGLUTAMINASE, IGA: Tissue Transglutaminase Ab, IgA: 1 U/mL (ref <4)

## 2015-05-17 NOTE — Assessment & Plan Note (Signed)
54 y/o female with one year y/o abdominal bloating associated with nausea, frequent loose stools. Colonoscopy up to date. Abdominal ultrasound showed cholelithiasis. She has a fullness in right mid-abdomen without discrete mass, unclear significance.   Unclear if gallstones are the etiology of her symptoms. Request stopping fiber supplements right now, may be contributing to her bloating. Continue probiotics. Add PPI at least one month to see if any improvement in symptoms. Check celiac serologies.   If no improvement and/or celiac screen is negative, she will require further work up, ie possibly CT A/P vs EGD.

## 2015-05-21 NOTE — Progress Notes (Signed)
CC'ED TO PCP 

## 2015-05-30 NOTE — Progress Notes (Signed)
Quick Note:  Pt is aware and OK to schedule OV in 2-3 weeks. ______

## 2015-05-30 NOTE — Progress Notes (Signed)
APPT MADE AND PATIENT CALLED

## 2015-05-30 NOTE — Progress Notes (Signed)
Quick Note:  Please let patient know her celiac screen was negative.  How is she doing? I would like for her to come back in 2-3 weeks for follow-up (abdominal fullness in right upper abdomen on exam). If still persistent, then may need CT. ______

## 2015-06-20 ENCOUNTER — Ambulatory Visit: Payer: PRIVATE HEALTH INSURANCE | Admitting: Gastroenterology

## 2015-06-26 NOTE — Progress Notes (Signed)
Quick Note:  Doris, I don't see where she has had OV scheduled? ______

## 2015-06-26 NOTE — Progress Notes (Signed)
Quick Note:  Laura Beasley, looks like the appt was scheduled for 06/20/2015 and pt cancelled on 06/18/2015 because she did not have a ride.  I have left Vm for her to call to reschedule. ______

## 2015-06-27 NOTE — Progress Notes (Signed)
Quick Note:  OK. Let's send her a letter and ask her to reschedule. The letter about do not neglect your health. Pending issues that need to be addressed. ______

## 2015-06-27 NOTE — Progress Notes (Signed)
Quick Note:  Letter mailed to pt ______ 

## 2019-03-13 ENCOUNTER — Encounter: Payer: Self-pay | Admitting: Gastroenterology

## 2019-08-25 NOTE — Progress Notes (Signed)
REVIEWED-NO ADDITIONAL RECOMMENDATIONS. 

## 2019-09-11 ENCOUNTER — Other Ambulatory Visit: Payer: Self-pay

## 2019-09-11 ENCOUNTER — Ambulatory Visit: Payer: PRIVATE HEALTH INSURANCE | Attending: Internal Medicine

## 2019-09-11 DIAGNOSIS — Z20822 Contact with and (suspected) exposure to covid-19: Secondary | ICD-10-CM

## 2019-09-12 LAB — NOVEL CORONAVIRUS, NAA: SARS-CoV-2, NAA: NOT DETECTED

## 2021-01-02 ENCOUNTER — Other Ambulatory Visit (HOSPITAL_COMMUNITY): Payer: Self-pay | Admitting: Family Medicine

## 2021-01-02 DIAGNOSIS — M5442 Lumbago with sciatica, left side: Secondary | ICD-10-CM

## 2021-01-06 ENCOUNTER — Other Ambulatory Visit: Payer: Self-pay

## 2021-01-06 ENCOUNTER — Ambulatory Visit (HOSPITAL_COMMUNITY)
Admission: RE | Admit: 2021-01-06 | Discharge: 2021-01-06 | Disposition: A | Discharge status: Home / Self Care | Payer: Self-pay | Source: Ambulatory Visit | Attending: Family Medicine | Admitting: Family Medicine

## 2021-01-06 DIAGNOSIS — M5442 Lumbago with sciatica, left side: Secondary | ICD-10-CM | POA: Insufficient documentation

## 2021-01-20 ENCOUNTER — Encounter: Payer: Self-pay | Admitting: Orthopaedic Surgery

## 2021-04-21 IMAGING — DX DG LUMBAR SPINE COMPLETE 4+V
5 series · 5 of 5 positions shown · non-contrast
Comparison: None.

CLINICAL DATA: Low back pain radiating to left hip

EXAM:
LUMBAR SPINE - COMPLETE 4+ VIEW

[l-spine ap]
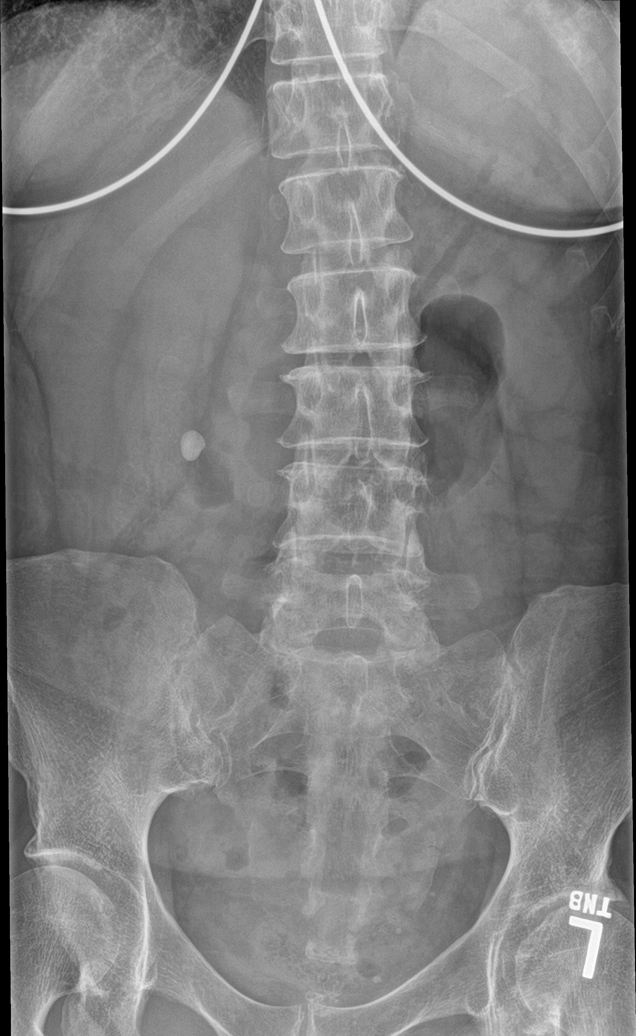

[l-spine obl (1 of 2)]
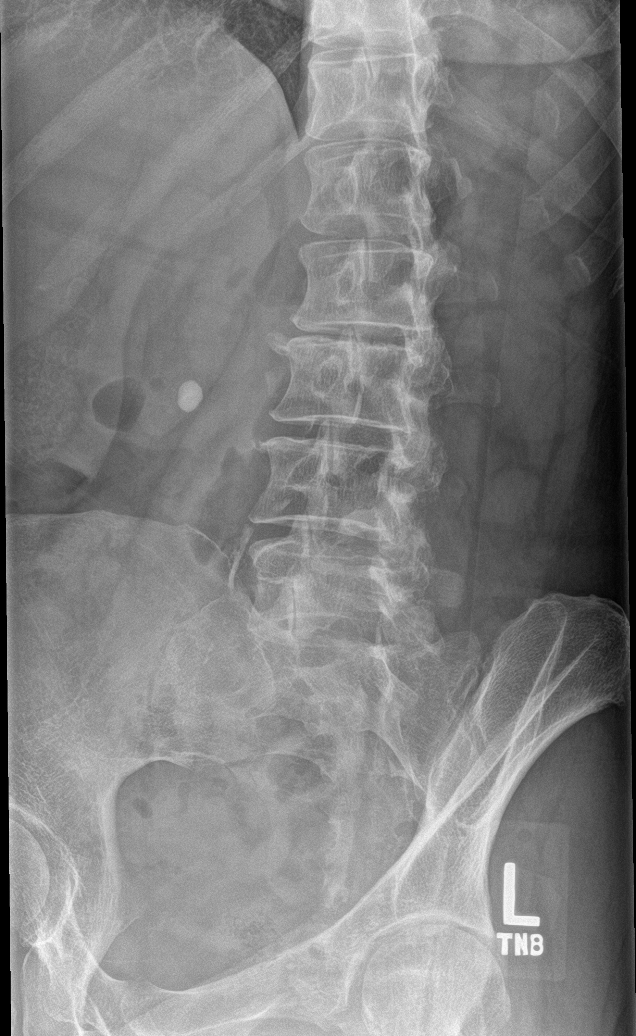

[l-spine obl (2 of 2)]
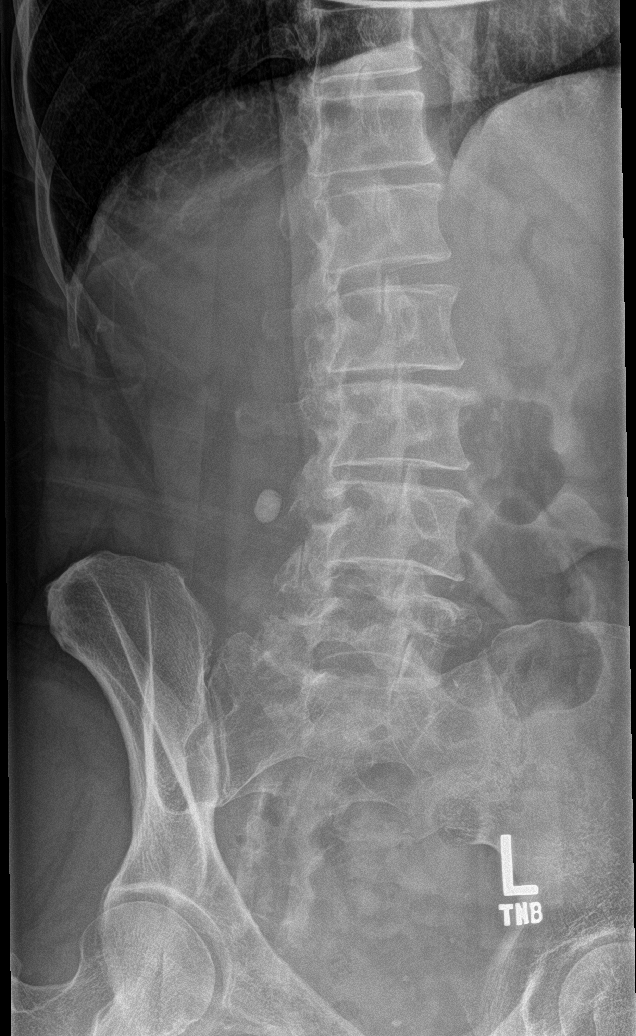

[l-spine lat]
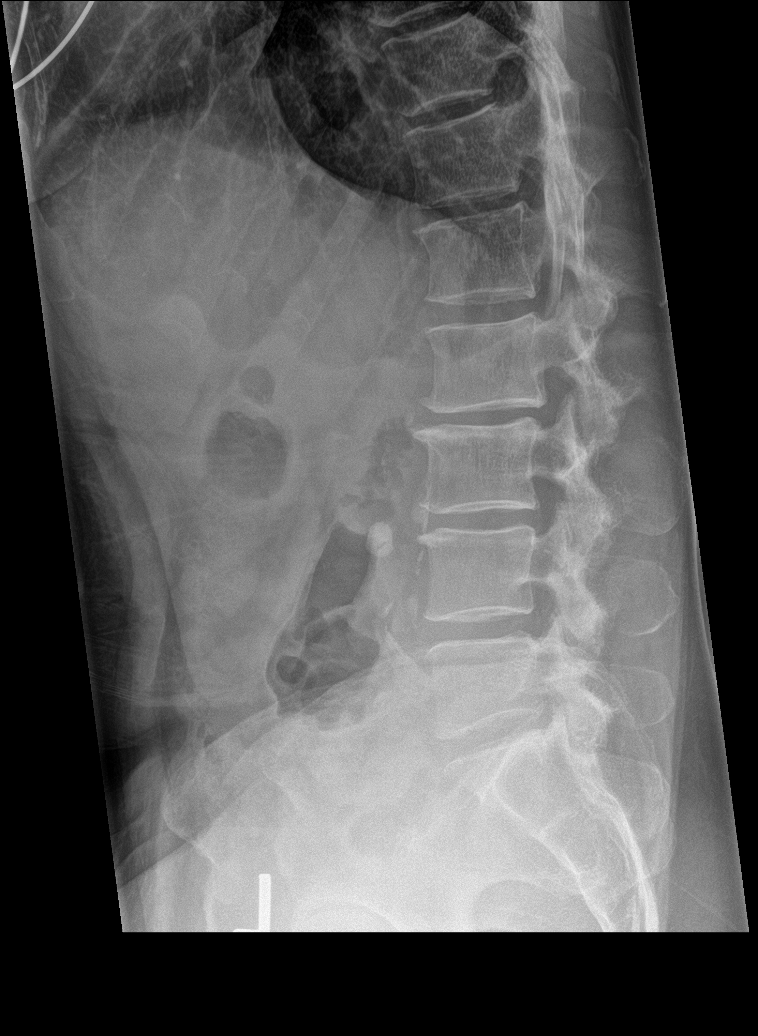

[l-spine spot]
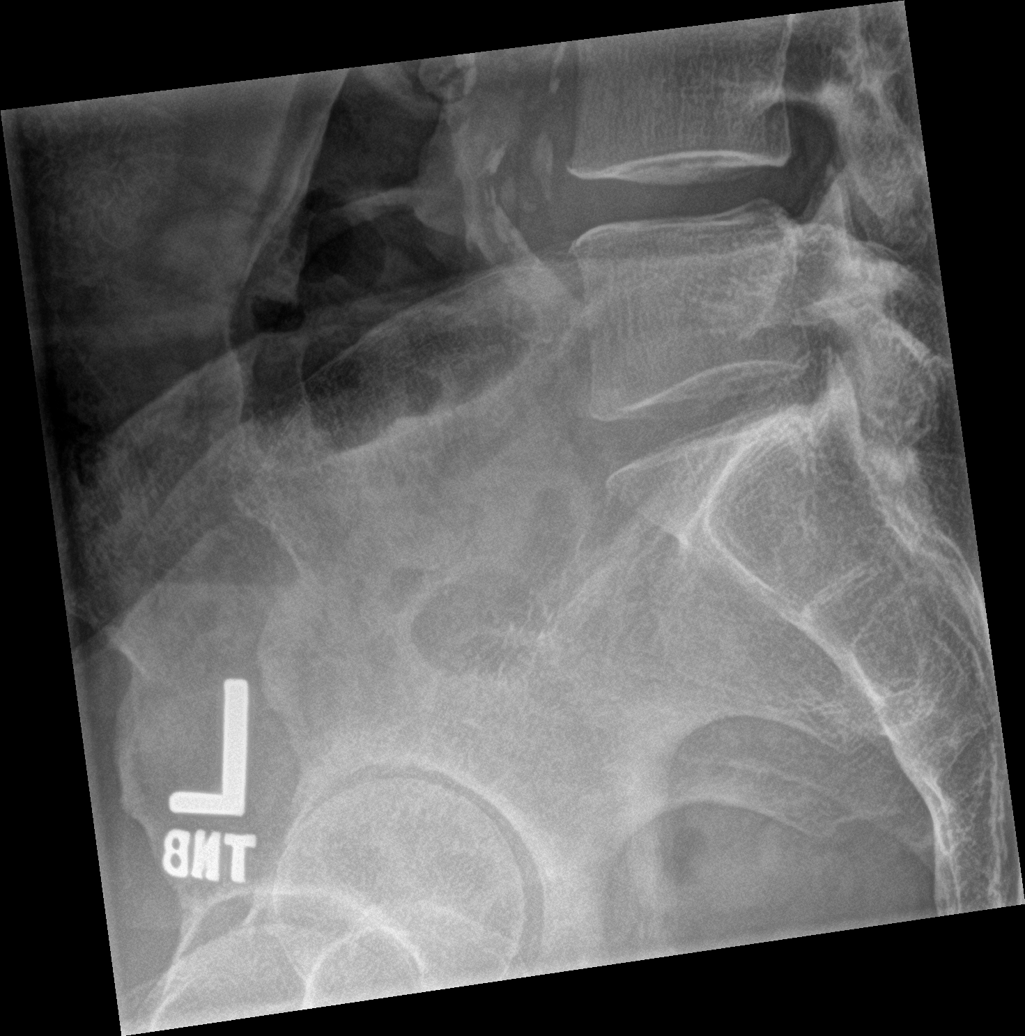

[5 of 5 positions shown; findings below may reference images not displayed]

FINDINGS: Frontal, bilateral oblique, and lateral views of the lumbar spine
are obtained. There are 5 non-rib-bearing lumbar type vertebral
bodies with mild left convex curvature centered at L3. Multilevel
spondylosis greatest at the L2/L3 level. Mild diffuse facet
hypertrophy.

The sacroiliac joints are normal. Rounded calcification within the
right mid abdomen measures up to 12 mm in size, likely a right renal
calculus. Faint calcified gallstones are also noted.
IMPRESSION: 1. Mild lumbar spondylosis and facet hypertrophy greatest at L2-3.
2. No acute fracture.
3. Incidental right nephrolithiasis as well as cholelithiasis.

## 2022-11-19 ENCOUNTER — Encounter: Payer: Self-pay | Admitting: Radiology
